# Patient Record
Sex: Female | Born: 1980 | Race: Black or African American | Hispanic: No | Marital: Single
Health system: Midwestern US, Academic
[De-identification: ages and names within clinical notes are randomized; demographics above are authoritative.]

---

## 2013-04-13 ENCOUNTER — Emergency Department (HOSPITAL_COMMUNITY): Payer: No Typology Code available for payment source

## 2013-04-13 ENCOUNTER — Encounter (HOSPITAL_COMMUNITY): Payer: Self-pay | Admitting: Emergency Medicine

## 2013-04-13 ENCOUNTER — Emergency Department (HOSPITAL_COMMUNITY)
Admission: EM | Admit: 2013-04-13 | Discharge: 2013-04-13 | Disposition: A | Discharge status: Home / Self Care | Payer: No Typology Code available for payment source | Attending: Emergency Medicine | Admitting: Emergency Medicine

## 2013-04-13 DIAGNOSIS — Y9389 Activity, other specified: Secondary | ICD-10-CM | POA: Insufficient documentation

## 2013-04-13 DIAGNOSIS — S0083XA Contusion of other part of head, initial encounter: Secondary | ICD-10-CM

## 2013-04-13 DIAGNOSIS — S8000XA Contusion of unspecified knee, initial encounter: Secondary | ICD-10-CM | POA: Insufficient documentation

## 2013-04-13 DIAGNOSIS — Y92410 Unspecified street and highway as the place of occurrence of the external cause: Secondary | ICD-10-CM | POA: Insufficient documentation

## 2013-04-13 DIAGNOSIS — S0003XA Contusion of scalp, initial encounter: Secondary | ICD-10-CM | POA: Insufficient documentation

## 2013-04-13 DIAGNOSIS — S8001XA Contusion of right knee, initial encounter: Secondary | ICD-10-CM

## 2013-04-13 DIAGNOSIS — S1093XA Contusion of unspecified part of neck, initial encounter: Principal | ICD-10-CM

## 2013-04-13 DIAGNOSIS — S0990XA Unspecified injury of head, initial encounter: Secondary | ICD-10-CM | POA: Insufficient documentation

## 2013-04-13 MED ORDER — TRAMADOL HCL 50 MG PO TABS: 50.0000 mg | ORAL_TABLET | Freq: Four times a day (QID) | ORAL | Status: AC | PRN

## 2013-04-13 MED ORDER — BACITRACIN ZINC 500 UNIT/GM EX OINT: 1.0000 "application " | TOPICAL_OINTMENT | Freq: Two times a day (BID) | CUTANEOUS | Status: AC

## 2013-04-13 MED ORDER — NAPROXEN 500 MG PO TABS: 500.0000 mg | ORAL_TABLET | Freq: Two times a day (BID) | ORAL | Status: AC

## 2013-04-13 NOTE — ED Notes (Signed)
Sitting in the wheelchair holding her child
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
## 2013-04-13 NOTE — Discharge Instructions (Signed)
Your x-rays show no signs of broken bones or brain injury or facial fractures. Take the medication as prescribed, and will likely take 7-10 days to totally healed. He may want to eat soft diet for the next week to help prevent ongoing pain to her jaw.  Please call your doctor for a followup appointment within 24-48 hours. When you talk to your doctor please let them know that you were seen in the emergency department and have them acquire all of your records so that they can discuss the findings with you and formulate a treatment plan to fully care for your new and ongoing problems.   Emergency Department Resource Guide 1) Find a Doctor and Pay Out of Pocket Although you won't have to find out who is covered by your insurance plan, it is a good idea to ask around and get recommendations. You will then need to call the office and see if the doctor you have chosen will accept you as a new patient and what types of options they offer for patients who are self-pay. Some doctors offer discounts or will set up payment plans for their patients who do not have insurance, but you will need to ask so you aren't surprised when you get to your appointment.  2) Contact Your Local Health Department Not all health departments have doctors that can see patients for sick visits, but many do, so it is worth a call to see if yours does. If you don't know where your local health department is, you can check in your phone book. The CDC also has a tool to help you locate your state's health department, and many state websites also have listings of all of their local health departments.  3) Find a Walk-in Clinic If your illness is not likely to be very severe or complicated, you may want to try a walk in clinic. These are popping up all over the country in pharmacies, drugstores, and shopping centers. They're usually staffed by nurse practitioners or physician assistants that have been trained to treat common illnesses and  complaints. They're usually fairly quick and inexpensive. However, if you have serious medical issues or chronic medical problems, these are probably not your best option.  No Primary Care Doctor: - Call Health Connect at  [phone] - they can help you locate a primary care doctor that  accepts your insurance, provides certain services, etc. - Physician Referral Service- [phone]  Chronic Pain Problems: Organization         Address  Phone   Notes  [facility]  [phone] Patients need to be referred by their primary care doctor.   Medication Assistance: Organization         Address  Phone   Notes  [facility] Medication [facility] [address] [place], [place] [address] [phone] --Must be a resident of [facility] [place] -- Must have NO insurance coverage whatsoever (no Medicaid/ Medicare, etc.) -- The pt. MUST have a primary care doctor that directs their care regularly and follows them in the community   MedAssist  [phone]   [employer]  [phone]    Agencies that provide inexpensive medical care: Organization         Address  Phone   Notes  [facility]  [phone]   [facility]    [phone]   [facility] [address] [place], [place] [address] [phone]  [facility] [address] [place] [phone]   Planned Parenthood    [phone]   [facility]    [phone]   [place] and [place] [address], [place] Phone:  [phone], Fax:  [phone] Hours of Operation:  9 am - 6 pm, M-F.  Also accepts Medicaid/Medicare and self-pay.  [facility] for [place] [address], [address], [place] Phone: [phone], Fax: [phone]. Hours of Operation:  8:30 am - 5:30 pm, M-F.  Also accepts Medicaid and self-pay.  [facility] [place] [address] [place] Phone: [phone]   [place], [place], [place] [phone], Ext. [phone] Mondays & Thursdays: 7-9 AM.  First 15 patients are seen on a first come, first serve basis.    [place] Providers:  Organization         Address  Phone   Notes  [facility] [address], [place] [phone] Also accepts self-pay patients.  [facility] [address], [place]  [phone]   [place], [address], [place] [phone]   [facility] [address], [place] [phone]   Lucianne Lei [address], [place]   [phone] Only accepts [place] Access [place] patients after they have their name applied to their card.   Self-Pay (no insurance) in [facility] [place]:  Organization         Address  Phone   Notes  Sickle Cell Patients, [facility] [place] [phone]   [facility] [place] [phone]   [facility] [place]  [place], [place], [place] [phone]   [facility]/Dr. Osei-Bonsu  [address] [place] or [address], [place] [phone] Phone number for both [place] and [place] locations is the same.  Urgent Medical and [facility] [address] [place] [phone]   [facility] [address] [place] or [address] [phone]   [facility] [address], [place] [phone], phone; [phone], fax Sees patients 1st and 3rd Saturday of every month.  Must not qualify for public or private insurance (i.e. Medicaid, Medicare, [place] Health Choice, Veterans' Benefits)  Household income should be no more than 200% of the poverty level  The clinic cannot treat you if you are pregnant or think you are pregnant  Sexually transmitted diseases are not treated at the clinic.    Dental Care: Organization         Address  Phone  Notes  [facility] [place] [phone] Accepts children up to age 97 who are enrolled in [place] or [place]; pregnant women with a Medicaid card; and children who have applied for Medicaid or [place] Health Choice, but were declined, whose parents can pay a reduced fee at time of service.  [facility]  [address], [place] [phone] Accepts children up to age 11 who are enrolled in [place] or [place]; pregnant women with a Medicaid card; and  children who have applied for Medicaid or [place] Health Choice, but were declined, whose parents can pay a reduced fee at time of service.  [place] Adult Dental Access PROGRAM  [place] [phone] Patients are seen by appointment only. Walk-ins are not accepted. [place] will see patients 65 years of age and older. Monday - Tuesday (8am-5pm) Most Wednesdays (8:30-5pm) $30 per visit, cash only  [place] Adult [facility] Access PROGRAM  [address], [facility] [phone] Patients are seen by appointment only. Walk-ins are not accepted. [place] will see patients 17 years of age and older. One Wednesday Evening (Monthly: Volunteer Based).  $30 per visit, cash only  [place]  [phone] for adults; Children under age 53, call Graduate Pediatric Dentistry at [phone]. Children aged 31-14, please call [phone] to request a pediatric application.  Dental services are provided in all areas of dental care including fillings, crowns and bridges, complete and partial dentures, implants, gum treatment, root canals, and extractions. Preventive care is  also provided. Treatment is provided to both adults and children. Patients are selected via a lottery and there is often a waiting list.   [facility] [address] [place]  [phone] [email or web address]   [facility] [address] [place], [place] [phone], Ext. [phone] Second and Fourth Thursday of each month, opens at 6:30 AM; Clinic ends at 9 AM.  Patients are seen on a first-come first-served basis, and a limited number are seen during each clinic.   [facility]  [address] [place]ard Danker [place], [place] [phone]   Eligibility Requirements You must have lived in [place], [place], or [place] counties for at least the last three months.   You cannot be eligible for state or federal sponsored [employer], including [employer], [place], or [employer].   You generally cannot be eligible for healthcare insurance through your employer.    How to apply: Eligibility screenings are held every Tuesday and Wednesday afternoon from 1:00 pm until 4:00 pm. You do not need an appointment for the interview!  [facility] [address] [place], [place]   [place]  [place] Department  [place]  [phone]    Behavioral Health Resources in the Community: Intensive Outpatient Programs Organization         Address  Phone  Notes  [place]. [address] [place], [place] [phone]   [facility] Outpatient [address], [place], [place]   ADS: Alcohol & Drug Svcs [address] [place], [place]   [place] [address],  [place], [place] or [phone]   Substance Abuse Resources Organization         Address  Phone  Notes  Alcohol and Drug Services  [phone]   [place]  [phone]   [facility] [place]   Chinita Pester  [phone]   Residential & Outpatient Substance Abuse Program  [phone]   Psychological Services Organization         Address  Phone  Notes  [facility] [place]  [place]  [phone]   [place] [address], [place] or [phone]    Mobile Crisis Teams Organization  Address  Phone  Notes  [facility], Mobile Crisis Care Unit  [phone]   [facility]  [address] [place], [place]   [facility] [address], [place] [phone]    Self-Help/Support Groups Organization         Address  Phone             Notes  [place]. of [place] - variety of support groups  [place] Call for more information  Narcotics Anonymous (NA), [facility] [address], [employer] [place]  2 meetings at this location   Special educational needs teacher         Address  Phone  Notes  [facility] Residential Treatment [place],    [place]  [phone]   [facility]  [address] [place] [phone], [place], [place]   [place], [place] [phone] Admissions: 8am-3pm M-F  Incentives Substance [place] [address]    [place], [place] [phone]   [facility] [address] [place], [place], [place]   [facility] [address],  [place], [place]   [facility] Programs - Intensive Outpatient [address], [place], [place]   [facility] ([place].,  [place], [place] [phone] or [phone]   [facility] Services (RTS) [address] [place], [place] Accepts Medicaid  [facility] [place] [address]  [place] [phone]  Substance Abuse/Addiction Treatment   [facility] Organization         Address  Phone  Notes  [facility]  [phone]   Domenic Schwab, PhD [address] Arlis Port[place], [place]   [phone] or [phone]   [place], [place] [phone]   [facility] [address] [place], [place] [phone] Insurance/Medicaid/sponsorship through [facility] and Families [address], [address] [place]                                    [place], [place] [phone] [place] [address], [place] [phone]    Dr. Adele Schilder  [phone]   [facility] Dept. 1) [address] [place] 2) [place] 3)  [place] [address], [place] [phone]  [phone]   [place] [phone] or [phone] (After Hours)
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
## 2016-10-08 ENCOUNTER — Encounter: Admit: 2016-10-08 | Discharge: 2016-10-08 | Payer: MEDICAID

## 2016-10-09 ENCOUNTER — Encounter: Admit: 2016-10-09 | Discharge: 2016-10-09 | Payer: MEDICAID

## 2016-10-09 ENCOUNTER — Ambulatory Visit: Admit: 2016-10-09 | Discharge: 2016-10-09 | Payer: MEDICAID

## 2016-10-10 NOTE — Progress Notes
Maternal-Fetal Medicine Consult (see AS for details)    Ashley Taylor is a 36 yo [id number] at [redacted]w[redacted]d who was referred for a consult due to concern for vasa previa.  She has had 6 vaginal deliveries.  5 have been at term, her most recent delivery was preterm, at 34 weeks.     On our scan today, there is a low lying anterior placenta, with a posterior succenturiate lobe.  The vessels connecting the two lobes are just right and lateral to the cervix, consistent with vasa previa.    I reviewed these findings with Ashley Taylor today.  I described the risks of vasa previa including vessel tearing and rapid fetal bleeding in the event of PTL or PPROM.  We also discussed that there is a small chance that this may resolve.  As the uterus grows, the anterior placenta and posterior succenturiate lobe may become further away from the cervix - in other words the vasa previa may resolve as the pregnancy advances.      A/P: 36 yo [id number] at [redacted]w[redacted]d with a low-lying placenta with a succenturiate lobe and vasa previa.     - I would like for Ashley Taylor to return at 28 weeks to evaluate the placenta and vessel location.  If vasa previa still present (vessels < 2 cm from cervical os) - we will make plans to manage as such (outlined below)  - The best available data to guide the management of patients with vasa previa is derived from retrospective data, expert opinion and decision analysis models. We recommend twice weekly NSTs beginning at 28 weeks to look for any evidence of cord compression.   - Because of the increased risk of emergency preterm delivery, I recommend administration of a course of betamethasone between 28 and 32 weeks of gestation (closer to 32 weeks if no adverse events have occurred prior to that time).   - Given her history of PTL at 34 weeks in her last pregnancy, I recommend hospital admission around 32 weeks of gestation to allow for more frequent fetal heart rate monitoring; typically we perform NSTs on our patients two to three times daily. In the setting of labor, PPROM, repetitive variables unresponsive to tocolysis, or vaginal bleeding thought to be associated with the vasa  previa (i.e. fetal tachycardia, sinusoidal fetal heart rate pattern, confirmation of fetal blood source by Kleihauer Betke analysis)  an emergency cesarean delivery should occur.   - There are no high quality data on which to base a recommendation for optimal timing for delivery. There is one large series and decision analyses whose investigators have recommended delivery at about 35-36 weeks of gestation, without assessment of fetal lung maturity.     A total of 35 minutes were spent with the patient, > 50% was spent in face-to-face counseling on the risks and management of the above complications in pregnancy.    Gita Kudo, MD  Maternal-Fetal Medicine

## 2016-10-17 NOTE — Progress Notes
Franne FortsRhonda S Pankowski presents for an ultrasound encounter. Past Medical, Surgical, Family & Social History; Medications & Allergies contained in the electronic record below were not reviewed today and may not be up-to-date. Please see [facility] report for all documentation related to this encounter.

## 2016-11-20 ENCOUNTER — Ambulatory Visit: Admit: 2016-11-20 | Discharge: 2016-11-20 | Payer: MEDICAID

## 2016-11-20 DIAGNOSIS — O4440 Low lying placenta NOS or without hemorrhage, unspecified trimester: Principal | ICD-10-CM

## 2016-12-02 NOTE — Progress Notes
JACQUELYNN FRIEND presents for an ultrasound encounter. Past Medical, Surgical, Family & Social History; Medications & Allergies contained in the electronic record below were not reviewed today and may not be up-to-date. Please see [facility]BG[facility] report for all documentation related to this encounter.    12/02/2016  Butch Penny, MA

## 2016-12-19 ENCOUNTER — Ambulatory Visit: Admit: 2016-12-19 | Discharge: 2016-12-19 | Payer: MEDICAID

## 2016-12-19 DIAGNOSIS — O4403 Placenta previa specified as without hemorrhage, third trimester: Principal | ICD-10-CM

## 2017-01-13 NOTE — Progress Notes
Franne FortsRhonda S Geddis presents for an ultrasound encounter. Past Medical, Surgical, Family & Social History; Medications & Allergies contained in the electronic record below were not reviewed today and may not be up-to-date. Please see [facility]BG[facility] report for all documentation related to this encounter.    01/13/2017  Kathyrn LassMaria Mattson Dayal, MA

## 2017-05-06 ENCOUNTER — Encounter: Admit: 2017-05-06 | Discharge: 2017-05-06 | Payer: MEDICAID
